# Patient Record
Sex: Female | Born: 1980 | Race: White | Hispanic: No
Health system: Southern US, Community
[De-identification: ages and names within clinical notes are randomized; demographics above are authoritative.]

## PROBLEM LIST (undated history)

## (undated) DIAGNOSIS — J45909 Unspecified asthma, uncomplicated: Secondary | ICD-10-CM

## (undated) DIAGNOSIS — Z8669 Personal history of other diseases of the nervous system and sense organs: Secondary | ICD-10-CM

## (undated) DIAGNOSIS — M542 Cervicalgia: Secondary | ICD-10-CM

## (undated) DIAGNOSIS — G43909 Migraine, unspecified, not intractable, without status migrainosus: Secondary | ICD-10-CM

## (undated) HISTORY — PX: APPENDECTOMY: SHX54

## (undated) HISTORY — DX: Personal history of other diseases of the nervous system and sense organs: Z86.69

## (undated) HISTORY — DX: Cervicalgia: M54.2

## (undated) HISTORY — DX: Unspecified asthma, uncomplicated: J45.909

---

## 2012-06-14 DIAGNOSIS — J45909 Unspecified asthma, uncomplicated: Secondary | ICD-10-CM | POA: Insufficient documentation

## 2013-09-03 ENCOUNTER — Emergency Department (HOSPITAL_BASED_OUTPATIENT_CLINIC_OR_DEPARTMENT_OTHER)
Admission: EM | Admit: 2013-09-03 | Discharge: 2013-09-03 | Disposition: A | Discharge status: Home / Self Care | Payer: Medicaid Other | Attending: Emergency Medicine | Admitting: Emergency Medicine

## 2013-09-03 ENCOUNTER — Encounter (HOSPITAL_BASED_OUTPATIENT_CLINIC_OR_DEPARTMENT_OTHER): Payer: Self-pay | Admitting: Emergency Medicine

## 2013-09-03 DIAGNOSIS — M542 Cervicalgia: Secondary | ICD-10-CM | POA: Diagnosis not present

## 2013-09-03 DIAGNOSIS — R51 Headache: Secondary | ICD-10-CM

## 2013-09-03 DIAGNOSIS — Z880 Allergy status to penicillin: Secondary | ICD-10-CM | POA: Insufficient documentation

## 2013-09-03 DIAGNOSIS — G43909 Migraine, unspecified, not intractable, without status migrainosus: Secondary | ICD-10-CM | POA: Diagnosis not present

## 2013-09-03 DIAGNOSIS — R519 Headache, unspecified: Secondary | ICD-10-CM

## 2013-09-03 HISTORY — DX: Migraine, unspecified, not intractable, without status migrainosus: G43.909

## 2013-09-03 MED ORDER — SODIUM CHLORIDE 0.9 % IV SOLN
Freq: Once | INTRAVENOUS | Status: AC
  Administered 2013-09-03: 19:00:00 via INTRAVENOUS

## 2013-09-03 MED ORDER — KETOROLAC TROMETHAMINE 30 MG/ML IJ SOLN
30.0000 mg | Freq: Once | INTRAMUSCULAR | Status: AC
  Administered 2013-09-03: 30 mg via INTRAVENOUS
  Filled 2013-09-03: qty 1

## 2013-09-03 MED ORDER — METOCLOPRAMIDE HCL 5 MG/ML IJ SOLN
10.0000 mg | Freq: Once | INTRAMUSCULAR | Status: AC
  Administered 2013-09-03: 10 mg via INTRAVENOUS
  Filled 2013-09-03: qty 2

## 2013-09-03 MED ORDER — DIPHENHYDRAMINE HCL 50 MG/ML IJ SOLN
25.0000 mg | Freq: Once | INTRAMUSCULAR | Status: AC
  Administered 2013-09-03: 25 mg via INTRAVENOUS
  Filled 2013-09-03: qty 1

## 2013-09-03 NOTE — ED Provider Notes (Signed)
CSN: [id number]     Arrival date & time 09/03/13  1703 History   First MD Initiated Contact with Patient 09/03/13 1724     Chief Complaint  Patient presents with  . Migraine     (Consider location/radiation/quality/duration/timing/severity/associated sxs/prior Treatment) Patient is a 33 y.o. female presenting with migraines. The history is provided by the patient. No language interpreter was used.  Migraine This is a new problem. The current episode started today. The problem occurs constantly. The problem has been unchanged. Associated symptoms include neck pain. Pertinent negatives include no vomiting. Nothing aggravates the symptoms. She has tried nothing for the symptoms. The treatment provided moderate relief.    Past Medical History  Diagnosis Date  . Migraines    Past Surgical History  Procedure Laterality Date  . Appendectomy     No family history on file. History  Substance Use Topics  . Smoking status: Never Smoker   . Smokeless tobacco: Not on file  . Alcohol Use: No   OB History   Grav Para Term Preterm Abortions TAB SAB Ect Mult Living                 Review of Systems  Gastrointestinal: Negative for vomiting.  Musculoskeletal: Positive for neck pain.  All other systems reviewed and are negative.     Allergies  Penicillins  Home Medications   Prior to Admission medications   Medication Sig Start Date End Date Taking? Authorizing Provider  Carisoprodol (SOMA PO) Take by mouth.   Yes Historical Provider, MD  HYDROcodone-acetaminophen (NORCO) 10-325 MG per tablet Take 1 tablet by mouth every 6 (six) hours as needed.   Yes Historical Provider, MD  isometheptene-acetaminophen-dichloralphenazone (MIDRIN) 65-325-100 MG capsule Take by mouth 4 (four) times daily as needed for migraine. Maximum 5 capsules in 12 hours for migraine headaches, 8 capsules in 24 hours for tension headaches.   Yes Historical Provider, MD  SUMAtriptan Succinate (IMITREX PO) Take by  mouth.   Yes Historical Provider, MD   BP 131/83  Pulse 94  Temp(Src) 98 F (36.7 C) (Oral)  Resp 16  Ht  (1.575 m)  Wt 170 lb (77.111 kg)  BMI 31.09 kg/m2  SpO2 100% Physical Exam  Nursing note and vitals reviewed. Constitutional: She is oriented to person, place, and time. She appears well-developed and well-nourished.  HENT:  Head: Normocephalic and atraumatic.  Eyes: Conjunctivae and EOM are normal. Pupils are equal, round, and reactive to light.  Neck: Normal range of motion.  Cardiovascular: Normal rate and normal heart sounds.   Pulmonary/Chest: Effort normal.  Abdominal: She exhibits no distension.  Musculoskeletal: Normal range of motion.  Neurological: She is alert and oriented to person, place, and time.  Skin: Skin is warm.  Psychiatric: She has a normal mood and affect.    ED Course  Procedures (including critical care time) Labs Review Labs Reviewed - No data to display  Imaging Review No results found.   EKG Interpretation None      MDM Pt has a bulging disc in her neck.   Pt is scheduled to se Neurosurgery in [place] for evaluation   Final diagnoses:  Headache disorder    Pt given torodol, reglan and benadryl  IV.      Lonia Skinner Mooar, PA-C 09/03/13 1956

## 2013-09-03 NOTE — Discharge Instructions (Signed)

## 2013-09-03 NOTE — ED Notes (Signed)
Pt c/o headache and photophobia x 2 days. Pt has h/o migraines. Pt sts midrin and imitrex usually helps. Pt drove self to ED. Pt denies n/v. Pt sts back of neck is "sore".

## 2013-09-04 NOTE — ED Provider Notes (Signed)
Medical screening examination/treatment/procedure(s) were performed by non-physician practitioner and as supervising physician I was immediately available for consultation/collaboration.   EKG Interpretation None        Yannick Steuber, MD 09/04/13 0957 

## 2013-10-09 DIAGNOSIS — M5030 Other cervical disc degeneration, unspecified cervical region: Secondary | ICD-10-CM | POA: Insufficient documentation

## 2013-10-09 DIAGNOSIS — G4486 Cervicogenic headache: Secondary | ICD-10-CM | POA: Insufficient documentation

## 2013-10-09 DIAGNOSIS — R51 Headache: Secondary | ICD-10-CM

## 2013-10-09 DIAGNOSIS — M542 Cervicalgia: Secondary | ICD-10-CM | POA: Insufficient documentation

## 2013-10-09 DIAGNOSIS — M4802 Spinal stenosis, cervical region: Secondary | ICD-10-CM | POA: Insufficient documentation

## 2013-10-09 DIAGNOSIS — M5412 Radiculopathy, cervical region: Secondary | ICD-10-CM | POA: Insufficient documentation

## 2013-12-17 ENCOUNTER — Other Ambulatory Visit: Payer: Self-pay | Admitting: Neurosurgery

## 2013-12-17 DIAGNOSIS — M5030 Other cervical disc degeneration, unspecified cervical region: Secondary | ICD-10-CM

## 2014-01-20 ENCOUNTER — Ambulatory Visit
Admission: RE | Admit: 2014-01-20 | Discharge: 2014-01-20 | Disposition: A | Discharge status: Home / Self Care | Payer: Medicaid Other | Source: Ambulatory Visit | Attending: Neurosurgery | Admitting: Neurosurgery

## 2014-01-20 DIAGNOSIS — M5030 Other cervical disc degeneration, unspecified cervical region: Secondary | ICD-10-CM

## 2014-01-20 MED ORDER — IOHEXOL 300 MG/ML  SOLN
1.0000 mL | Freq: Once | INTRAMUSCULAR | Status: AC | PRN
Start: 2014-01-20 — End: 2014-01-20
  Administered 2014-01-20: 1 mL via EPIDURAL

## 2014-01-20 MED ORDER — TRIAMCINOLONE ACETONIDE 40 MG/ML IJ SUSP (RADIOLOGY)
60.0000 mg | Freq: Once | INTRAMUSCULAR | Status: AC
  Administered 2014-01-20: 60 mg via EPIDURAL

## 2014-01-20 NOTE — Discharge Instructions (Signed)

## 2015-09-06 ENCOUNTER — Encounter (HOSPITAL_BASED_OUTPATIENT_CLINIC_OR_DEPARTMENT_OTHER): Payer: Self-pay | Admitting: *Deleted

## 2015-09-06 ENCOUNTER — Emergency Department (HOSPITAL_BASED_OUTPATIENT_CLINIC_OR_DEPARTMENT_OTHER)
Admission: EM | Admit: 2015-09-06 | Discharge: 2015-09-06 | Disposition: A | Discharge status: Home / Self Care | Payer: Medicaid Other | Attending: Emergency Medicine | Admitting: Emergency Medicine

## 2015-09-06 DIAGNOSIS — M542 Cervicalgia: Secondary | ICD-10-CM | POA: Diagnosis not present

## 2015-09-06 DIAGNOSIS — Y92410 Unspecified street and highway as the place of occurrence of the external cause: Secondary | ICD-10-CM | POA: Diagnosis not present

## 2015-09-06 DIAGNOSIS — Y9389 Activity, other specified: Secondary | ICD-10-CM | POA: Diagnosis not present

## 2015-09-06 DIAGNOSIS — R51 Headache: Secondary | ICD-10-CM | POA: Insufficient documentation

## 2015-09-06 DIAGNOSIS — Y999 Unspecified external cause status: Secondary | ICD-10-CM | POA: Insufficient documentation

## 2015-09-06 DIAGNOSIS — J45909 Unspecified asthma, uncomplicated: Secondary | ICD-10-CM | POA: Diagnosis not present

## 2015-09-06 DIAGNOSIS — S199XXA Unspecified injury of neck, initial encounter: Secondary | ICD-10-CM | POA: Diagnosis present
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
## 2015-09-06 NOTE — Discharge Instructions (Signed)
Follow up with your primary care provider in 2-3 days if symptoms are not improving. Use ice and heat on your neck and use ibuprofen as needed for pain.    Return to the emergency department if you experience headache, visual changes, abdominal pain, nausea, vomiting, numbness/timing, weakness or any other concerning symptoms.
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
## 2017-04-17 ENCOUNTER — Encounter: Payer: Self-pay | Admitting: Medical

## 2017-04-17 ENCOUNTER — Ambulatory Visit: Payer: 59 | Admitting: Medical

## 2017-04-17 VITALS — BP 116/79 | HR 94 | Temp 98.1°F | Resp 16 | Ht 62.0 in | Wt 138.0 lb

## 2017-04-17 DIAGNOSIS — N926 Irregular menstruation, unspecified: Secondary | ICD-10-CM | POA: Diagnosis not present

## 2017-04-17 DIAGNOSIS — Z8669 Personal history of other diseases of the nervous system and sense organs: Secondary | ICD-10-CM | POA: Diagnosis not present

## 2017-04-17 DIAGNOSIS — M542 Cervicalgia: Secondary | ICD-10-CM

## 2017-04-17 LAB — POCT URINE PREGNANCY: Preg Test, Ur: NEGATIVE

## 2017-04-17 MED ORDER — MELOXICAM 7.5 MG PO TABS: ORAL_TABLET | ORAL | 0 refills | Status: DC

## 2017-04-17 MED ORDER — SUMATRIPTAN SUCCINATE 50 MG PO TABS: 50.0000 mg | ORAL_TABLET | ORAL | 0 refills | Status: DC | PRN

## 2017-04-17 NOTE — Progress Notes (Signed)
Subjective:    Patient ID: Kylie Hart, female    DOB: April 13, 1980, 37 y.o.   MRN: [id number]  HPI   Pt in for first time.  Pt states she is on her lunch break.  Pt expresses needs to return to work. Pt accrued large bill in the past with former practice. She can't return there until bill paid.  She has history of neck pain and had xrays done. Pt states she had neck procedure in the past. Pt in past has seen neurosurgeon. Her neck issue could have required surgery but due to her age they opted for PT instead. Pt does have some radicular pain in her neck.   Last couple of years she states was pain medication and muscle relaxers.   Pt last saw PA at [facility] 3 months ago approximate. They were giving her refills of muscle relaxants. Pt was getting somas. Also in the past was getting some pain medication intermittently.   Pt also mentions she also had possible overdose. She was on clonazepam at that time.   lmp- Pt states not sure when her last menstrual cycle was.   She used to be on depo injection. Used to be on depo(pt states not pregnant since not active).    Review of Systems  HENT: Negative for congestion.   Respiratory: Negative for cough, chest tightness, shortness of breath and wheezing.   Cardiovascular: Negative for chest pain and palpitations.  Gastrointestinal: Negative for abdominal pain.  Genitourinary: Negative for difficulty urinating, dysuria, enuresis and genital sores.  Musculoskeletal: Positive for neck pain. Negative for back pain, joint swelling and neck stiffness.  Skin: Negative for rash.  Neurological: Negative for dizziness, seizures, speech difficulty, weakness and light-headedness.  Hematological: Negative for adenopathy. Does not bruise/bleed easily.  Psychiatric/Behavioral: Negative for behavioral problems, confusion, sleep disturbance and suicidal ideas. The patient is not nervous/anxious.     Past Medical History:  Diagnosis Date  . Migraines        Social History   Socioeconomic History  . Marital status: Married    Spouse name: Not on file  . Number of children: Not on file  . Years of education: Not on file  . Highest education level: Not on file  Occupational History  . Not on file  Social Needs  . Financial resource strain: Not on file  . Food insecurity:    Worry: Not on file    Inability: Not on file  . Transportation needs:    Medical: Not on file    Non-medical: Not on file  Tobacco Use  . Smoking status: Never Smoker  . Smokeless tobacco: Never Used  Substance and Sexual Activity  . Alcohol use: No  . Drug use: No  . Sexual activity: Yes    Birth control/protection: Injection  Lifestyle  . Physical activity:    Days per week: Not on file    Minutes per session: Not on file  . Stress: Not on file  Relationships  . Social connections:    Talks on phone: Not on file    Gets together: Not on file    Attends religious service: Not on file    Active member of club or organization: Not on file    Attends meetings of clubs or organizations: Not on file    Relationship status: Not on file  . Intimate partner violence:    Fear of current or ex partner: Not on file    Emotionally abused: Not on file  Physically abused: Not on file    Forced sexual activity: Not on file  Other Topics Concern  . Not on file  Social History Narrative  . Not on file    Past Surgical History:  Procedure Laterality Date  . APPENDECTOMY      Family History  Problem Relation Age of Onset  . COPD Mother   . Breast cancer Mother     Allergies  Allergen Reactions  . Penicillins     Current Outpatient Medications on File Prior to Visit  Medication Sig Dispense Refill  . Carisoprodol (SOMA PO) Take by mouth.    Marland Kitchen. ibuprofen (ADVIL,MOTRIN) 200 MG tablet Take 200 mg by mouth.    . isometheptene-acetaminophen-dichloralphenazone (MIDRIN) 65-325-100 MG capsule Take by mouth.    . SUMAtriptan Succinate (IMITREX PO) Take  by mouth.     No current facility-administered medications on file prior to visit.     BP 116/79   Pulse 94   Temp 98.1 F (36.7 C) (Oral)   Resp 16   Ht 5\' 2"  (1.575 m)   Wt 138 lb (62.6 kg)   SpO2 99%   BMI 25.24 kg/m       Objective:   Physical Exam  General Mental Status- Alert. General Appearance- Not in acute distress.   Skin General: Color- Normal Color. Moisture- Normal Moisture.  Neck Carotid Arteries- Normal color. Moisture- Normal Moisture. No carotid bruits. No JVD.  Chest and Lung Exam Auscultation: Breath Sounds:-Normal.  Cardiovascular Auscultation:Rythm- Regular. Murmurs & Other Heart Sounds:Auscultation of the heart reveals- No Murmurs.  Abdomen Inspection:-Inspeection Normal. Palpation/Percussion:Note:No mass. Palpation and Percussion of the abdomen reveal- Non Tender, Non Distended + BS, no rebound or guarding.  Neurologic Cranial Nerve exam:- CN III-XII intact(No nystagmus), symmetric smile. Strength:- 5/5 equal and symmetric strength both upper and lower extremities.      Assessment & Plan:  For your history of chronic neck pain, I do want you to try meloxicam 1-2 tabs daily as needed.  Also can continue the Soma presently but in the near future I might prefer to use other muscle relaxant such as Zanaflex.  Think this would be safer and less sedating.  For history of migraines, I did refill your Imitrex.  Maximum number of tablets per day for Imitrex would be 2 tablets within any 24-hour.  Please sign release of information form so we can get your old records from [place].  On the way out you can ask receptionist staff availability for CPE next week.

## 2017-04-17 NOTE — Patient Instructions (Signed)
For your history of chronic neck pain, I do want you to try meloxicam 1-2 tabs daily as needed.  Also can continue the Soma presently but in the near future I might prefer to use other muscle relaxant such as Zanaflex.  Think this would be safer and less sedating.  Esperanza RichtersEdward Adelbert Gaspard, PA-C  For history of migraines, I did refill your Imitrex.  Maximum number of tablets per day for Imitrex would be 2 tablets within any 24-hour.  Please sign release of information form so we can get your old records from [place].  On the way out you can ask receptionist staff availability for CPE next week.

## 2017-07-25 ENCOUNTER — Encounter: Payer: Self-pay | Admitting: Medical

## 2017-07-25 ENCOUNTER — Ambulatory Visit: Payer: 59 | Admitting: Medical

## 2017-07-25 VITALS — BP 115/77 | HR 80 | Temp 98.3°F | Ht 62.0 in | Wt 180.4 lb

## 2017-07-25 DIAGNOSIS — M542 Cervicalgia: Secondary | ICD-10-CM | POA: Diagnosis not present

## 2017-07-25 DIAGNOSIS — Z8669 Personal history of other diseases of the nervous system and sense organs: Secondary | ICD-10-CM

## 2017-07-25 MED ORDER — MELOXICAM 7.5 MG PO TABS: ORAL_TABLET | ORAL | 0 refills | Status: DC

## 2017-07-25 MED ORDER — TIZANIDINE HCL 6 MG PO CAPS: 6.0000 mg | ORAL_CAPSULE | Freq: Three times a day (TID) | ORAL | 0 refills | Status: AC | PRN

## 2017-07-25 MED ORDER — TIZANIDINE HCL 6 MG PO CAPS: 6.0000 mg | ORAL_CAPSULE | Freq: Three times a day (TID) | ORAL | 0 refills | Status: DC | PRN

## 2017-07-25 MED ORDER — SUMATRIPTAN SUCCINATE 50 MG PO TABS: 50.0000 mg | ORAL_TABLET | ORAL | 0 refills | Status: DC | PRN

## 2017-07-25 MED ORDER — MELOXICAM 7.5 MG PO TABS: ORAL_TABLET | ORAL | 0 refills | Status: AC

## 2017-07-25 MED ORDER — SUMATRIPTAN SUCCINATE 50 MG PO TABS: 50.0000 mg | ORAL_TABLET | ORAL | 0 refills | Status: AC | PRN

## 2017-07-25 NOTE — Patient Instructions (Addendum)
For history of neck pain I placed c spine xray order. Please get that today or within a week.  I prescribed meloxicam for neck pain and also zanaflex muscle relaxant.   For migraine ha history I refilled imitrex.  Please sign release form so we can get old records. [facility] Monday and let them know we are sending record request.   Please update me in two weeks how your are doing with the above.  Follow up one month or as needed

## 2017-07-25 NOTE — Progress Notes (Signed)
Pre visit review using our clinic review tool, if applicable. No additional management support is needed unless otherwise documented below in the visit note. 

## 2017-07-25 NOTE — Progress Notes (Signed)
Subjective:    Patient ID: Kylie Hart, female    DOB: Sep 14, 1980, 37 y.o.   MRN: [id number]  HPI  Pt in for follow up.  Pt does have history of using soma for migraines with neck pain.  Pt did not take mobic that I rx'd on last visit.   I did consider giving her zanaflex. But looks like I did not prescribe that. She actually had soma and appears this is why did not write. I explained to her soma in not preferred muscle relaxant and that I don't feel comfortable prescribing soma in most cases.   Pt did not fill the imitrex.  I have not gotten records yet.   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 Surgery was recommended in the past about 5 years ago but she states did not do.  She summarizes she get daily neck pain and HA for years but with no different features and does have any gross motor/sensory function deficits.  No current HA presently.  LMP- 3 weeks ago.    Review of Systems  Constitutional: Negative for chills, fatigue and fever.  Respiratory: Negative for cough, chest tightness, shortness of breath and wheezing.   Cardiovascular: Negative for chest pain and palpitations.  Gastrointestinal: Negative for abdominal distention, abdominal pain and anal bleeding.  Musculoskeletal: Positive for neck pain. Negative for back pain, joint swelling and myalgias.  Neurological: Negative for dizziness, seizures, speech difficulty, weakness, light-headedness and numbness.       Not currently with ha. But states almost every day.  Hematological: Negative for adenopathy. Does not bruise/bleed easily.  Psychiatric/Behavioral: Negative for behavioral problems and confusion.    Past Medical History:  Diagnosis Date  . Asthma    when younger but as adult no major issues per pt.  . History of migraine   . Migraines   . Neck pain      Social History   Socioeconomic History  . Marital status: Married    Spouse name: Not on file  . Number of  children: Not on file  . Years of education: Not on file  . Highest education level: Not on file  Occupational History  . Not on file  Social Needs  . Financial resource strain: Not on file  . Food insecurity:    Worry: Not on file    Inability: Not on file  . Transportation needs:    Medical: Not on file    Non-medical: Not on file  Tobacco Use  . Smoking status: Never Smoker  . Smokeless tobacco: Never Used  Substance and Sexual Activity  . Alcohol use: No  . Drug use: No  . Sexual activity: Yes    Birth control/protection: Injection  Lifestyle  . Physical activity:    Days per week: Not on file    Minutes per session: Not on file  . Stress: Not on file  Relationships  . Social connections:    Talks on phone: Not on file    Gets together: Not on file    Attends religious service: Not on file    Active member of club or organization: Not on file    Attends meetings of clubs or organizations: Not on file    Relationship status: Not on file  . Intimate partner violence:    Fear of current or ex partner: Not on file    Emotionally abused: Not on file    Physically abused: Not on file  Forced sexual activity: Not on file  Other Topics Concern  . Not on file  Social History Narrative  . Not on file    Past Surgical History:  Procedure Laterality Date  . APPENDECTOMY      Family History  Problem Relation Age of Onset  . COPD Mother   . Breast cancer Mother     Allergies  Allergen Reactions  . Penicillins     No current outpatient medications on file prior to visit.   No current facility-administered medications on file prior to visit.     BP 115/77 (BP Location: Left Arm, Patient Position: Sitting, Cuff Size: Small)   Pulse 80   Temp 98.3 F (36.8 C) (Oral)   Ht 5\' 2"  (1.575 m)   Wt 180 lb 6 oz (81.8 kg)   SpO2 100%   BMI 32.99 kg/m       Objective:   Physical Exam  General Mental Status- Alert. General Appearance- Not in acute distress.     Skin General: Color- Normal Color. Moisture- Normal Moisture.  Neck Carotid Arteries- Normal color. Moisture- Normal Moisture. No carotid bruits. No JVD. Bilateral trapezius tenderness to palpation. Mild mid cervical spinal tenderness.   Chest and Lung Exam Auscultation: Breath Sounds:-Normal.  Cardiovascular Auscultation:Rythm- Regular. Murmurs & Other Heart Sounds:Auscultation of the heart reveals- No Murmurs.   Neurologic Cranial Nerve exam:- CN III-XII intact(No nystagmus), symmetric smile. Strength:- 5/5 equal and symmetric strength both upper and lower extremities.      Assessment & Plan:  For history of neck pain I placed c spine xray order. Please get that today or within a week.  I prescribed meloxicam for neck pain and also zanaflex muscle relaxant.   For migraine ha history I refilled imitrex.  Please sign release form so we can get old records. Please call [facility] on Monday and let them know we are sending record request.   Please update me in two weeks how your are doing with the above.  Follow up one month or as needed   Whole FoodsEdward Jeslin Bazinet, PA-C

## 2017-07-29 ENCOUNTER — Encounter: Payer: Self-pay | Admitting: Medical

## 2017-07-30 ENCOUNTER — Telehealth: Payer: Self-pay | Admitting: Medical

## 2017-07-30 NOTE — Telephone Encounter (Signed)
Copied from CRM [id number]. Topic: Quick Communication - See Telephone Encounter >> Jul 30, 2017  5:35 PM Lorrine KinMcGee, Dakiyah Heinke B, NT wrote: CRM for notification. See Telephone encounter for: 07/30/17. Patient calling and is requesting a call back from Esperanza RichtersEdward Saguier to discuss a different muscle relaxer that is cheaper. Please advise.  [id number] [place] - [address] AT [address] CB#: [phone]

## 2017-07-31 ENCOUNTER — Encounter: Payer: Self-pay | Admitting: Medical

## 2017-07-31 ENCOUNTER — Telehealth: Payer: Self-pay | Admitting: Medical

## 2017-07-31 NOTE — Telephone Encounter (Signed)
I appreciate your response back. However, I do think at this time, I may just look into other options. You are a big practice, and I know how that goes, I, as a patient get lost. And, my daily health is most important to me. I think it is absurd that apparently so many doctors are against giving me the medication I have been on for years, a muscle relaxer, not even something as severe as a pain medication. So, I will see what other options fit me and my schedule at this time.       Lucretia RoersLindsey Cobb    Above is patient email message. Thus I decided dismissal was in our mutual best interest.

## 2017-07-31 NOTE — Telephone Encounter (Signed)
I typed patient dismissal letter. Tried to print from home. Not sure if it was printed. Will you print and then give for me to sign.  Note to whom it is concerned regarding pt dismissal. See her my chart message where she states it is absurd that I won't prescribe soma and she expresses she will be seeking care at other facility.

## 2017-07-31 NOTE — Telephone Encounter (Signed)
Pt called to f/up on request.

## 2017-07-31 NOTE — Telephone Encounter (Signed)
Will you get pt dismissal letter off the printer and give to me.

## 2017-08-01 ENCOUNTER — Telehealth: Payer: Self-pay | Admitting: Medical

## 2017-08-01 NOTE — Telephone Encounter (Signed)
Discussed with pharmacist patient's cost for Zanaflex and Skelaxin.  Both generics cost about $85.  I do not want to prescribe her Soma.  I did ask pharmacist to run Flexeril and I believe he mentioned 10 mg tablets #30 would cost patient about $2.  So I will send her back in email seeing if she is ever used that before and will give her a brief course during the interim while she finds another provider as she stated in the email and while we wait for her to get dismissal letter as well.

## 2017-08-01 NOTE — Telephone Encounter (Signed)
Opened to review 

## 2017-08-02 MED ORDER — CYCLOBENZAPRINE HCL 10 MG PO TABS: ORAL_TABLET | ORAL | 0 refills | Status: AC

## 2017-08-02 NOTE — Telephone Encounter (Signed)
Sent my chart message.(have her review) Ask her to review can send in flexeril. Per her pharmacist cost $2 dollars approximate. If ok with that will send in.

## 2017-08-08 ENCOUNTER — Telehealth: Payer: Self-pay | Admitting: Medical

## 2017-08-08 NOTE — Telephone Encounter (Signed)
Patient dismissed from [facility] by Esperanza RichtersEdward Saguier PA-C, effective July 31, 2017. Dismissal letter sent out by certified / registered mail.  daj

## 2017-09-18 ENCOUNTER — Telehealth: Payer: Self-pay | Admitting: Medical

## 2017-09-18 NOTE — Telephone Encounter (Signed)
Rec'd Certified Letter - Return to Sender, Undeliverable, Unclaimed, Unable to [employer].  Letter has been remailed 1st class 09/18/17

## 2018-02-10 ENCOUNTER — Emergency Department (HOSPITAL_BASED_OUTPATIENT_CLINIC_OR_DEPARTMENT_OTHER): Payer: Self-pay

## 2018-02-10 ENCOUNTER — Other Ambulatory Visit: Payer: Self-pay

## 2018-02-10 ENCOUNTER — Emergency Department (HOSPITAL_BASED_OUTPATIENT_CLINIC_OR_DEPARTMENT_OTHER)
Admission: EM | Admit: 2018-02-10 | Discharge: 2018-02-10 | Disposition: A | Discharge status: Home / Self Care | Payer: Self-pay | Attending: Emergency Medicine | Admitting: Emergency Medicine

## 2018-02-10 ENCOUNTER — Encounter (HOSPITAL_BASED_OUTPATIENT_CLINIC_OR_DEPARTMENT_OTHER): Payer: Self-pay | Admitting: Emergency Medicine

## 2018-02-10 DIAGNOSIS — J45909 Unspecified asthma, uncomplicated: Secondary | ICD-10-CM | POA: Insufficient documentation

## 2018-02-10 DIAGNOSIS — J181 Lobar pneumonia, unspecified organism: Secondary | ICD-10-CM

## 2018-02-10 DIAGNOSIS — G43109 Migraine with aura, not intractable, without status migrainosus: Secondary | ICD-10-CM | POA: Insufficient documentation

## 2018-02-10 DIAGNOSIS — J189 Pneumonia, unspecified organism: Secondary | ICD-10-CM | POA: Insufficient documentation

## 2018-02-10 LAB — PREGNANCY, URINE: Preg Test, Ur: NEGATIVE

## 2018-02-10 MED ORDER — KETOROLAC TROMETHAMINE 30 MG/ML IJ SOLN
30.0000 mg | Freq: Once | INTRAMUSCULAR | Status: AC
  Administered 2018-02-10: 30 mg via INTRAVENOUS
  Filled 2018-02-10: qty 1

## 2018-02-10 MED ORDER — DOXYCYCLINE HYCLATE 100 MG PO CAPS: 100.0000 mg | ORAL_CAPSULE | Freq: Two times a day (BID) | ORAL | 0 refills | Status: AC

## 2018-02-10 MED ORDER — SODIUM CHLORIDE 0.9 % IV BOLUS
1000.0000 mL | Freq: Once | INTRAVENOUS | Status: AC
  Administered 2018-02-10: 1000 mL via INTRAVENOUS

## 2018-02-10 MED ORDER — ALBUTEROL SULFATE HFA 108 (90 BASE) MCG/ACT IN AERS: 1.0000 | INHALATION_SPRAY | Freq: Four times a day (QID) | RESPIRATORY_TRACT | 0 refills | Status: AC | PRN

## 2018-02-10 MED ORDER — BENZONATATE 100 MG PO CAPS: 100.0000 mg | ORAL_CAPSULE | Freq: Three times a day (TID) | ORAL | 0 refills | Status: AC

## 2018-02-10 NOTE — ED Triage Notes (Addendum)
5 days of URI symptoms, states "I took some clindamycin I had left over." Now states that she is having a migraine headache.

## 2018-02-10 NOTE — ED Notes (Signed)
Cough, congestion x 5 days  Took antibiotics that she had left over now has a ha

## 2018-02-10 NOTE — ED Notes (Signed)
Patient transported to X-ray 

## 2018-02-10 NOTE — Discharge Instructions (Signed)
Take the antibiotics as prescribed.  Please complete the entire course of antibiotics regardless of symptom improvement to prevent worsening or recurrence of your infection. Take Tessalon Perles as needed for cough. Continue your home medications as previously prescribed, but please discontinue the clindamycin. Return to ED for worsening symptoms, shortness of breath, chest pain, vomiting or coughing up blood or wheezing.

## 2018-02-10 NOTE — ED Provider Notes (Signed)
[facility] EMERGENCY DEPARTMENT Provider Note   CSN: [id number] Arrival date & time: 02/10/18  1350     History   Chief Complaint Chief Complaint  Patient presents with  . URI  . Migraine    HPI Kylie RoersLindsey Hitchman is a 38 y.o. female with a past medical history of migraine headaches, who presents to ED for evaluation of 5 day history of cough productive with mucus, bodyaches, bilateral ear pain/pressure.  Woke up this morning with a migraine headache.  She reports associated photophobia and phonophobia which is typical of her migraines.  States that this feels like her usual migraines.  No improvement with ibuprofen or Tylenol.  She has been taking several doses of clindamycin left over for the past several days.  Patient appears to be a poor historian so I am unsure how much clindamycin she took.  Did not receive her influenza vaccine this year.  Denies any recent travel, shortness of breath, vision changes, head injuries or falls. Denies personal or family history of aneurysms.  HPI  Past Medical History:  Diagnosis Date  . Asthma    when younger but as adult no major issues per pt.  . History of migraine   . Migraines   . Neck pain     Patient Active Problem List   Diagnosis Date Noted  . 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 Cervical pain 10/09/2013  . DDD (degenerative disc disease), cervical 10/09/2013  . Cervical spinal stenosis 10/09/2013  . Cervicogenic headache 10/09/2013  . 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 APPENDECTOMY       OB History   No obstetric history on file.      Home Medications    Prior to Admission medications   Medication Sig Start Date End Date Taking? Authorizing Provider  albuterol (PROVENTIL HFA;VENTOLIN HFA) 108 (90 Base) MCG/ACT inhaler Inhale 1-2 puffs into the lungs every 6 (six) hours as needed for wheezing or shortness of breath. 02/10/18   Tommy Goostree, PA-C    benzonatate (TESSALON) 100 MG capsule Take 1 capsule (100 mg total) by mouth every 8 (eight) hours. 02/10/18   Gianni Mihalik, PA-C  cyclobenzaprine (FLEXERIL) 10 MG tablet Take 1 tab every 8 hours as needed for pain. 08/02/17   Saguier, Ramon DredgeEdward, PA-C  doxycycline (VIBRAMYCIN) 100 MG capsule Take 1 capsule (100 mg total) by mouth 2 (two) times daily for 5 days. 02/10/18 02/15/18  Dietrich PatesKhatri, Oziel Beitler, PA-C  meloxicam (MOBIC) 7.5 MG tablet 1-2 tab po q day 07/25/17   Saguier, Ramon DredgeEdward, PA-C  SUMAtriptan (IMITREX) 50 MG tablet Take 1 tablet (50 mg total) by mouth every 2 (two) hours as needed for migraine. May repeat in 2 hours if headache persists or recurs. 07/25/17   Saguier, Ramon DredgeEdward, PA-C  tizanidine (ZANAFLEX) 6 MG capsule Take 1 capsule (6 mg total) by mouth 3 (three) times daily as needed for muscle spasms. 07/25/17   Saguier, Ramon DredgeEdward, PA-C    Family History Family History  Problem Relation Age of Onset  . COPD Mother   . Breast cancer Mother     Social History Social History   Tobacco Use  . Smoking status: Never Smoker  . Smokeless tobacco: Never Used  Substance Use Topics  . Alcohol use: No  . Drug use: No     Allergies   Penicillins   Review of Systems Review of Systems  Constitutional: Negative for appetite change, chills and fever.  HENT:  Positive for congestion and ear pain. Negative for rhinorrhea, sneezing and sore throat.   Eyes: Positive for photophobia. Negative for visual disturbance.  Respiratory: Positive for cough. Negative for chest tightness, shortness of breath and wheezing.   Cardiovascular: Negative for chest pain and palpitations.  Gastrointestinal: Negative for abdominal pain, blood in stool, constipation, diarrhea, nausea and vomiting.  Genitourinary: Negative for dysuria, hematuria and urgency.  Musculoskeletal: Positive for myalgias.  Skin: Negative for rash.  Neurological: Positive for headaches. Negative for dizziness, syncope, weakness and light-headedness.      Physical Exam Updated Vital Signs BP 115/85 (BP Location: Left Arm)   Pulse 91   Temp 98.8 F (37.1 C) (Oral)   Resp 16   SpO2 97%   Physical Exam Vitals signs and nursing note reviewed.  Constitutional:      General: She is not in acute distress.    Appearance: She is well-developed.  HENT:     Head: Normocephalic and atraumatic.     Right Ear: A middle ear effusion is present.     Left Ear: A middle ear effusion is present.     Nose: Nose normal.     Mouth/Throat:     Pharynx: Oropharynx is clear. Uvula midline.     Tonsils: Swelling: 0 on the right. 0 on the left.  Eyes:     General: No scleral icterus.       Right eye: No discharge.        Left eye: No discharge.     Conjunctiva/sclera: Conjunctivae normal.     Pupils: Pupils are equal, round, and reactive to light.  Neck:     Musculoskeletal: Normal range of motion and neck supple.  Cardiovascular:     Rate and Rhythm: Regular rhythm. Tachycardia present.     Heart sounds: Normal heart sounds. No murmur. No friction rub. No gallop.   Pulmonary:     Effort: Pulmonary effort is normal. No respiratory distress.     Breath sounds: Normal breath sounds.  Abdominal:     General: Bowel sounds are normal. There is no distension.     Palpations: Abdomen is soft.     Tenderness: There is no abdominal tenderness. There is no guarding.  Musculoskeletal: Normal range of motion.  Skin:    General: Skin is warm and dry.     Findings: No rash.  Neurological:     General: No focal deficit present.     Mental Status: She is alert and oriented to person, place, and time.     Cranial Nerves: No cranial nerve deficit.     Sensory: No sensory deficit.     Motor: No weakness or abnormal muscle tone.     Coordination: Coordination normal.      ED Treatments / Results  Labs (all labs ordered are listed, but only abnormal results are displayed) Labs Reviewed  PREGNANCY, URINE    EKG None  Radiology Dg Chest 2  View  Result Date: 02/10/2018 CLINICAL DATA:  Initial evaluation for 5 day history of cough, fever, URI symptoms. EXAM: CHEST - 2 VIEW COMPARISON:  Prior radiograph from 10/21/2013 FINDINGS: Cardiac and mediastinal silhouettes are stable in size and contour, and remain within normal limits. Lungs are hypoinflated with secondary mild bibasilar bronchovascular crowding. There is superimposed somewhat more confluent patchy opacity within the retrocardiac left lower lobe, which could potentially reflect a small infiltrate. This is not well seen on lateral view. No edema or effusion. No pneumothorax. Visualized osseous structures  within normal limits. IMPRESSION: Patchy retrocardiac left lower lobe opacity, potentially affecting a small/early infiltrate given provided history of cough/fever. Electronically Signed   By: Rise Mu M.D.   On: 02/10/2018 15:13    Procedures Procedures (including critical care time)  Medications Ordered in ED Medications  sodium chloride 0.9 % bolus 1,000 mL (0 mLs Intravenous Stopped 02/10/18 1618)  ketorolac (TORADOL) 30 MG/ML injection 30 mg (30 mg Intravenous Given 02/10/18 1547)     Initial Impression / Assessment and Plan / ED Course  I have reviewed the triage vital signs and the nursing notes.  Pertinent labs & imaging results that were available during my care of the patient were reviewed by me and considered in my medical decision making (see chart for details).     38 year old female presents to ED for 5-day history of cough productive with mucus, body aches, bilateral ear pain.  Woke up this morning with a migraine.  She has a history of migraines and this feels similar.  She has been self-medicating herself with several doses of clindamycin left over from a prior and infection.  On my exam she is overall well-appearing.  No deficits on neurological exam noted.  Vital signs are within normal limits.  Chest x-ray shows possible developing infiltrate in  the left lower lobe.  Patient reports improvement in symptoms with Toradol and fluids.  Suspect that her symptoms are due to her pneumonia. There are no headache characteristics that are lateralizing or concerning for increased ICP, infectious or vascular cause of her symptoms.  Will advise her to return to ED for any severe worsening symptoms.  Patient is hemodynamically stable, in NAD, and able to ambulate in the ED. Evaluation does not show pathology that would require ongoing emergent intervention or inpatient treatment. I explained the diagnosis to the patient. Pain has been managed and has no complaints prior to discharge. Patient is comfortable with above plan and is stable for discharge at this time. All questions were answered prior to disposition. Strict return precautions for returning to the ED were discussed. Encouraged follow up with PCP.    Portions of this note were generated with Scientist, clinical (histocompatibility and immunogenetics). Dictation errors may occur despite best attempts at proofreading.   Final Clinical Impressions(s) / ED Diagnoses   Final diagnoses:  Community acquired pneumonia of left lower lobe of lung (HCC)  Migraine with aura and without status migrainosus, not intractable    ED Discharge Orders         Ordered    doxycycline (VIBRAMYCIN) 100 MG capsule  2 times daily     02/10/18 1639    benzonatate (TESSALON) 100 MG capsule  Every 8 hours     02/10/18 1639    albuterol (PROVENTIL HFA;VENTOLIN HFA) 108 (90 Base) MCG/ACT inhaler  Every 6 hours PRN     02/10/18 1640           Dietrich Pates, PA-C 02/10/18 1643    Alvira Monday, MD 02/14/18 2217

## 2019-01-22 ENCOUNTER — Emergency Department (HOSPITAL_BASED_OUTPATIENT_CLINIC_OR_DEPARTMENT_OTHER)
Admission: EM | Admit: 2019-01-22 | Discharge: 2019-01-23 | Disposition: A | Discharge status: Home / Self Care | Payer: Self-pay | Attending: Emergency Medicine | Admitting: Emergency Medicine

## 2019-01-22 ENCOUNTER — Other Ambulatory Visit: Payer: Self-pay

## 2019-01-22 DIAGNOSIS — K529 Noninfective gastroenteritis and colitis, unspecified: Secondary | ICD-10-CM | POA: Insufficient documentation

## 2019-01-22 DIAGNOSIS — Z79899 Other long term (current) drug therapy: Secondary | ICD-10-CM | POA: Insufficient documentation

## 2019-01-22 DIAGNOSIS — Z880 Allergy status to penicillin: Secondary | ICD-10-CM | POA: Insufficient documentation

## 2019-01-22 DIAGNOSIS — E876 Hypokalemia: Secondary | ICD-10-CM | POA: Insufficient documentation

## 2019-01-23 ENCOUNTER — Emergency Department (HOSPITAL_BASED_OUTPATIENT_CLINIC_OR_DEPARTMENT_OTHER): Payer: Self-pay

## 2019-01-23 ENCOUNTER — Encounter (HOSPITAL_BASED_OUTPATIENT_CLINIC_OR_DEPARTMENT_OTHER): Payer: Self-pay | Admitting: *Deleted

## 2019-01-23 ENCOUNTER — Other Ambulatory Visit: Payer: Self-pay

## 2019-01-23 LAB — COMPREHENSIVE METABOLIC PANEL
ALT: 21 U/L (ref 0–44)
AST: 31 U/L (ref 15–41)
Albumin: 4.8 g/dL (ref 3.5–5.0)
Alkaline Phosphatase: 34 U/L — ABNORMAL LOW (ref 38–126)
Anion gap: 11 (ref 5–15)
BUN: 18 mg/dL (ref 6–20)
CO2: 22 mmol/L (ref 22–32)
Calcium: 9.2 mg/dL (ref 8.9–10.3)
Chloride: 102 mmol/L (ref 98–111)
Creatinine, Ser: 0.61 mg/dL (ref 0.44–1.00)
GFR calc Af Amer: >60 mL/min (ref >60)
GFR calc non Af Amer: >60 mL/min (ref >60)
Glucose, Bld: 96 mg/dL (ref 70–99)
Potassium: 2.9 mmol/L — ABNORMAL LOW (ref 3.5–5.1)
Sodium: 135 mmol/L (ref 135–145)
Total Bilirubin: 1 mg/dL (ref 0.3–1.2)
Total Protein: 7.6 g/dL (ref 6.5–8.1)

## 2019-01-23 LAB — CBC WITH DIFFERENTIAL/PLATELET
Abs Immature Granulocytes: 0.03 10*3/uL (ref 0.00–0.07)
Basophils Absolute: 0.1 10*3/uL (ref 0.0–0.1)
Basophils Relative: 1 %
Eosinophils Absolute: 0.1 10*3/uL (ref 0.0–0.5)
Eosinophils Relative: 1 %
HCT: 39.5 % (ref 36.0–46.0)
Hemoglobin: 13.5 g/dL (ref 12.0–15.0)
Immature Granulocytes: 0 %
Lymphocytes Relative: 24 %
Lymphs Abs: 2.1 10*3/uL (ref 0.7–4.0)
MCH: 31.1 pg (ref 26.0–34.0)
MCHC: 34.2 g/dL (ref 30.0–36.0)
MCV: 91 fL (ref 80.0–100.0)
Monocytes Absolute: 0.6 10*3/uL (ref 0.1–1.0)
Monocytes Relative: 7 %
Neutro Abs: 5.9 10*3/uL (ref 1.7–7.7)
Neutrophils Relative %: 67 %
Platelets: 280 10*3/uL (ref 150–400)
RBC: 4.34 MIL/uL (ref 3.87–5.11)
RDW: 12.1 % (ref 11.5–15.5)
WBC: 8.7 10*3/uL (ref 4.0–10.5)
nRBC: 0 % (ref 0.0–0.2)

## 2019-01-23 LAB — URINALYSIS, ROUTINE W REFLEX MICROSCOPIC
Bilirubin Urine: NEGATIVE
Glucose, UA: NEGATIVE mg/dL
Hgb urine dipstick: NEGATIVE
Ketones, ur: 40 mg/dL — AB
Leukocytes,Ua: NEGATIVE
Nitrite: NEGATIVE
Protein, ur: NEGATIVE mg/dL
Specific Gravity, Urine: >1.03 — ABNORMAL HIGH (ref 1.005–1.030)
pH: 6 (ref 5.0–8.0)

## 2019-01-23 LAB — PREGNANCY, URINE: Preg Test, Ur: NEGATIVE

## 2019-01-23 LAB — LIPASE, BLOOD: Lipase: 58 U/L — ABNORMAL HIGH (ref 11–51)

## 2019-01-23 MED ORDER — METRONIDAZOLE 500 MG PO TABS: 500.0000 mg | ORAL_TABLET | Freq: Three times a day (TID) | ORAL | 0 refills | Status: AC

## 2019-01-23 MED ORDER — CIPROFLOXACIN HCL 500 MG PO TABS: 500.0000 mg | ORAL_TABLET | Freq: Two times a day (BID) | ORAL | 0 refills | Status: AC

## 2019-01-23 MED ORDER — SODIUM CHLORIDE 0.9 % IV BOLUS (SEPSIS)
1000.0000 mL | Freq: Once | INTRAVENOUS | Status: AC
  Administered 2019-01-23: 1000 mL via INTRAVENOUS

## 2019-01-23 MED ORDER — POTASSIUM CHLORIDE CRYS ER 20 MEQ PO TBCR: 20.0000 meq | EXTENDED_RELEASE_TABLET | Freq: Every day | ORAL | 0 refills | Status: AC

## 2019-01-23 MED ORDER — IOHEXOL 300 MG/ML  SOLN
100.0000 mL | Freq: Once | INTRAMUSCULAR | Status: AC
  Administered 2019-01-23: 100 mL via INTRAVENOUS

## 2019-01-23 MED ORDER — PROBIOTIC PO CAPS: 1.0000 | ORAL_CAPSULE | Freq: Every day | ORAL | 1 refills | Status: AC

## 2019-01-23 MED ORDER — ONDANSETRON 4 MG PO TBDP: 4.0000 mg | ORAL_TABLET | Freq: Four times a day (QID) | ORAL | 0 refills | Status: AC | PRN

## 2019-01-23 MED ORDER — CIPROFLOXACIN HCL 500 MG PO TABS
500.0000 mg | ORAL_TABLET | Freq: Once | ORAL | Status: AC
  Administered 2019-01-23: 500 mg via ORAL
  Filled 2019-01-23: qty 1

## 2019-01-23 MED ORDER — POTASSIUM CHLORIDE CRYS ER 20 MEQ PO TBCR
40.0000 meq | EXTENDED_RELEASE_TABLET | Freq: Once | ORAL | Status: AC
  Administered 2019-01-23: 40 meq via ORAL
  Filled 2019-01-23: qty 2

## 2019-01-23 MED ORDER — DICYCLOMINE HCL 20 MG PO TABS: 20.0000 mg | ORAL_TABLET | Freq: Three times a day (TID) | ORAL | 0 refills | Status: AC | PRN

## 2019-01-23 MED ORDER — METRONIDAZOLE 500 MG PO TABS
500.0000 mg | ORAL_TABLET | Freq: Once | ORAL | Status: AC
  Administered 2019-01-23: 500 mg via ORAL
  Filled 2019-01-23: qty 1

## 2019-01-23 NOTE — Discharge Instructions (Addendum)
You may alternate Tylenol 1000 mg every 6 hours as needed for pain, fever and Ibuprofen 800 mg every 8 hours as needed for pain, fever.  Please take Ibuprofen with food.  Do not take more than 4000 mg of Tylenol (acetaminophen) in a 24 hour period.  You may take over-the-counter Imodium as needed for diarrhea.  We are treating you with antibiotics for possible infectious colitis.  I recommend close follow-up with a GI physician once antibiotics are complete to ensure resolution of symptoms.  They may recommend a colonoscopy as an outpatient.  I recommend while taking these antibiotics that you also take a probiotic daily.  Your potassium level was slightly low at 2.9 today.  I recommend follow-up with a primary care physician in 1 week to have this rechecked.   Steps to find a Primary Care Provider (PCP):  Call [phone] or [phone] to access "[place] Find a Doctor Service."  2.  You may also go on the [facility] website at [email or web address]  3.  [facility] also frequently accepts new patients.  [facility]  [address] [place] [address] [phone]  4.  There are also multiple [facility], [facility] and [facility] practices throughout the [place] that are frequently accepting new patients. You may find a clinic that is close to your home and contact them.  [email or web address] [phone]  [facility] [email or web address]  [email or web address] [phone]  [email or web address] [phone]  5.  Local Health Departments also can provide primary care services.  [facility]  [address] [place] [address] [phone]  [facility] Department [address] [place] [address] [phone]  [facility] Department [address] [place] [address]  [place]  [address] [phone]

## 2019-01-23 NOTE — ED Triage Notes (Addendum)
Pt c/o diffuse abd pain  After exercising x 1 day ago  denies n/v

## 2019-01-23 NOTE — ED Notes (Signed)
ED Provider at bedside. 

## 2019-01-23 NOTE — ED Notes (Signed)
Patient transported to CT 

## 2019-01-23 NOTE — ED Provider Notes (Signed)
TIME SEEN: 12:07 AM  CHIEF COMPLAINT: Abdominal pain, diarrhea  HPI: Patient is a 39 year old female with history of migraines, previous appendectomy who presents to the emergency department with generalized abdominal pain and diarrhea.  Patient states that she was working out with a trainer today and doing jump squats.  She states that she jumped very high and when she came down she felt like "my muscle ripped in half" in her abdomen.  She states that she thought she pulled a muscle and has been taking Tylenol and ibuprofen today with good relief.  She states however tonight her pain worsened and she felt like her abdomen appeared swollen and she had diarrhea "like I have never seen before".  No nausea or vomiting.  No fever.  No chest pain, shortness of breath, cough or Covid exposures.  No dysuria, hematuria, vaginal bleeding or discharge.  States that she suddenly felt like she was going to pass out tonight due to pain and became diaphoretic and states her lips appeared white "like a ghost".  States she initially went to [facility] hospital and while waiting in line felt like she had a very bad taste in her mouth and kept "clenching my teeth" due to pain.  States she is concerned she could have a hernia.  ROS: See HPI Constitutional: no fever  Eyes: no drainage  ENT: no runny nose   Cardiovascular:  no chest pain  Resp: no SOB  GI: no vomiting, + diarrhea GU: no dysuria Integumentary: no rash  Allergy: no hives  Musculoskeletal: no leg swelling  Neurological: no slurred speech ROS otherwise negative  PAST MEDICAL HISTORY/PAST SURGICAL HISTORY:  Past Medical History:  Diagnosis Date  . Asthma    when younger but as adult no major issues per pt.  . History of migraine   . Migraines   . Neck pain     MEDICATIONS:  Prior to Admission medications   Medication Sig Start Date End Date Taking? Authorizing Provider  albuterol (PROVENTIL HFA;VENTOLIN HFA) 108 (90 Base) MCG/ACT  inhaler Inhale 1-2 puffs into the lungs every 6 (six) hours as needed for wheezing or shortness of breath. 02/10/18   Khatri, Hina, PA-C  benzonatate (TESSALON) 100 MG capsule Take 1 capsule (100 mg total) by mouth every 8 (eight) hours. 02/10/18   Khatri, Hina, PA-C  cyclobenzaprine (FLEXERIL) 10 MG tablet Take 1 tab every 8 hours as needed for pain. 08/02/17   Saguier, Ramon Dredge, PA-C  meloxicam (MOBIC) 7.5 MG tablet 1-2 tab po q day 07/25/17   Saguier, Ramon Dredge, PA-C  SUMAtriptan (IMITREX) 50 MG tablet Take 1 tablet (50 mg total) by mouth every 2 (two) hours as needed for migraine. May repeat in 2 hours if headache persists or recurs. 07/25/17   Saguier, Ramon Dredge, PA-C  tizanidine (ZANAFLEX) 6 MG capsule Take 1 capsule (6 mg total) by mouth 3 (three) times daily as needed for muscle spasms. 07/25/17   Saguier, Ramon Dredge, PA-C    ALLERGIES:  Allergies  Allergen Reactions  . Penicillins     SOCIAL HISTORY:  Social History   Tobacco Use  . Smoking status: Never Smoker  . Smokeless tobacco: Never Used  Substance Use Topics  . Alcohol use: No    FAMILY HISTORY: Family History  Problem Relation Age of Onset  . COPD Mother   . Breast cancer Mother     EXAM: BP 116/80   Pulse 92   Temp 98.9 F (37.2 C) (Oral)   Resp 18  Ht 5\' 1"  (1.549 m)   Wt 59 kg   SpO2 100%   BMI 24.56 kg/m  CONSTITUTIONAL: Alert and oriented and responds appropriately to questions.  Appears uncomfortable HEAD: Normocephalic EYES: Conjunctivae clear, pupils appear equal, EOM appear intact ENT: normal nose; moist mucous membranes NECK: Supple, normal ROM CARD: RRR; S1 and S2 appreciated; no murmurs, no clicks, no rubs, no gallops RESP: Normal chest excursion without splinting or tachypnea; breath sounds clear and equal bilaterally; no wheezes, no rhonchi, no rales, no hypoxia or respiratory distress, speaking full sentences ABD/GI: Patient has hyperactive bowel sounds.  Her abdomen is nondistended and soft.  She is  diffusely tender especially throughout the middle of her abdomen around the umbilicus.  I do not appreciate any masses or hernia.  There is no redness, warmth, ecchymosis.  No guarding or rebound.  No peritoneal signs.  No tenderness in the right upper quadrant. BACK:  The back appears normal EXT: Normal ROM in all joints; no deformity noted, no edema; no cyanosis SKIN: Normal color for age and race; warm; no rash on exposed skin NEURO: Moves all extremities equally, ambulates with steady gait, normal speech PSYCH: The patient's mood and manner are appropriate.   MEDICAL DECISION MAKING: Discussed with patient that while this may be a pulled muscle I am concerned that her pain is worsening and she had an episode where she felt like she was going to pass out tonight secondary to pain and appeared pale, white and was diaphoretic.  She also reports severe diarrhea tonight.  Differential includes colitis, diverticulitis, hernia, bowel obstruction.  Will obtain labs, urine and a CT of her abdomen pelvis.  She declines any medicine for pain, diarrhea or nausea at this time.  ED PROGRESS: Patient's urine shows no sign of infection but does show moderate ketones.  Will give IV fluids.  Potassium level low at 2.9.  EKG shows no ischemia, no arrhythmia, no interval change.  Will give oral replacement.   1:40 AM  CT of the abdomen pelvis shows decompressed large bowel suspicious for diffuse mild acute colitis.  No bowel obstruction.  No hernia seen.  Will start patient on Cipro, Flagyl and probiotics..  Will give outpatient PCP as well as GI follow-up.  Will provide information for both she states she does not have a PCP or GI physician.  Will discharge with Bentyl, Zofran.    Recommended GI follow-up after antibiotic completion.  Low suspicion that this is inflammatory bowel disease that she has no history of the same, no family history and has never had GI issues before.  States she works in a  but does not work in the hospital and has not been in the hospital recently.  No known sick contacts, recent travel or antibiotic use.  Has only had one episode of diarrhea at home.  Low suspicion for C. Difficile.  Patient still declines pain medication.  I feel she is safe for discharge home.   At this time, I do not feel there is any life-threatening condition present. I have reviewed, interpreted and discussed all results (EKG, imaging, lab, urine as appropriate) and exam findings with patient/family. I have reviewed nursing notes and appropriate previous records.  I feel the patient is safe to be discharged home without further emergent workup and can continue workup as an outpatient as needed. Discussed usual and customary return precautions. Patient/family verbalize understanding and are comfortable with this plan.  Outpatient follow-up has been provided as needed.  All questions have been answered.     Date: 01/23/2019 1:20 AM  Rate: 90  Rhythm: normal sinus rhythm  QRS Axis: normal  Intervals: normal (PR interval 133 ms, QRS 89 ms, QTC 463 ms)  ST/T Wave abnormalities: normal  Conduction Disutrbances: none  Narrative Interpretation: unremarkable, no ischemia, normal intervals, no arrhythmia        Tanyiah Laurich was evaluated in Emergency Department on 01/23/2019 for the symptoms described in the history of present illness. She was evaluated in the context of the global COVID-19 pandemic, which necessitated consideration that the patient might be at risk for infection with the SARS-CoV-2 virus that causes COVID-19. Institutional protocols and algorithms that pertain to the evaluation of patients at risk for COVID-19 are in a state of rapid change based on information released by regulatory bodies including the CDC and federal and state organizations. These policies and algorithms were followed during the patient's care in the ED.  Patient was seen wearing N95, face shield, gloves.     Cortlan Dolin, Delice Bison, DO 01/23/19 [phone]

## 2019-07-03 ENCOUNTER — Emergency Department (HOSPITAL_BASED_OUTPATIENT_CLINIC_OR_DEPARTMENT_OTHER)
Admission: EM | Admit: 2019-07-03 | Discharge: 2019-07-03 | Disposition: A | Discharge status: Home / Self Care | Payer: 59 | Attending: Emergency Medicine | Admitting: Emergency Medicine

## 2019-07-03 ENCOUNTER — Other Ambulatory Visit: Payer: Self-pay

## 2019-07-03 ENCOUNTER — Encounter (HOSPITAL_BASED_OUTPATIENT_CLINIC_OR_DEPARTMENT_OTHER): Payer: Self-pay | Admitting: *Deleted

## 2019-07-03 DIAGNOSIS — Z7951 Long term (current) use of inhaled steroids: Secondary | ICD-10-CM | POA: Diagnosis not present

## 2019-07-03 DIAGNOSIS — J45909 Unspecified asthma, uncomplicated: Secondary | ICD-10-CM | POA: Insufficient documentation

## 2019-07-03 DIAGNOSIS — R070 Pain in throat: Secondary | ICD-10-CM | POA: Insufficient documentation

## 2019-07-03 DIAGNOSIS — K0889 Other specified disorders of teeth and supporting structures: Secondary | ICD-10-CM

## 2019-07-03 NOTE — ED Provider Notes (Signed)
[facility] EMERGENCY DEPARTMENT Provider Note   CSN: [id number] Arrival date & time: 07/03/19  1437     History Chief Complaint  Patient presents with  . Dental Pain    Kylie Hart is a 39 y.o. female with a past medical history significant for asthma and history of migraines who presents to the ED due to worsening right upper dental pain for the past week. Patient had a root canal yesterday by Dr. Georgann Housekeeper and is worried about possible sepsis from her root canal. Patient states she believes she has sepsis because her "legs shake" uncontrollably which started yesterday after a root canal. Denies fever and chills. She is currently on clindamycin and Tylenol. Patient is unable to take NSAIDs due to history of ulcers. Patient has requested azithromycin for her dental pain. Patient also admits to an intermittent sore throat. Denies difficulty swallowing, difficulties breathing, and changes to phonation. No aggravating or alleviating factors.  History obtained from patient and past medical records. No interpreter used during encounter.       Past Medical History:  Diagnosis Date  . Asthma    when younger but as adult no major issues per pt.  . History of migraine   . Migraines   . Neck pain     Patient Active Problem List   Diagnosis Date Noted  . 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 Cervical pain 10/09/2013  . DDD (degenerative disc disease), cervical 10/09/2013  . Cervical spinal stenosis 10/09/2013  . Cervicogenic headache 10/09/2013  . 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 APPENDECTOMY       OB History   No obstetric history on file.     Family History  Problem Relation Age of Onset  . COPD Mother   . Breast cancer Mother     Social History   Tobacco Use  . Smoking status: Never Smoker  . Smokeless tobacco: Never Used  Vaping Use  . Vaping Use: Never assessed  Substance Use Topics  .  Alcohol use: No  . Drug use: No    Home Medications Prior to Admission medications   Medication Sig Start Date End Date Taking? Authorizing Provider  acetaminophen-codeine (TYLENOL #3) 300-30 MG tablet Take by mouth every 4 (four) hours as needed for moderate pain.   Yes [provider]  CLINDAMYCIN HCL PO Take by mouth.   Yes [provider]  albuterol (PROVENTIL HFA;VENTOLIN HFA) 108 (90 Base) MCG/ACT inhaler Inhale 1-2 puffs into the lungs every 6 (six) hours as needed for wheezing or shortness of breath. 02/10/18   Khatri, Hina, PA-C  benzonatate (TESSALON) 100 MG capsule Take 1 capsule (100 mg total) by mouth every 8 (eight) hours. 02/10/18   Khatri, Hina, PA-C  ciprofloxacin (CIPRO) 500 MG tablet Take 1 tablet (500 mg total) by mouth every 12 (twelve) hours. 01/23/19   Ward, Delice Bison, DO  cyclobenzaprine (FLEXERIL) 10 MG tablet Take 1 tab every 8 hours as needed for pain. 08/02/17   Saguier, Percell Miller, PA-C  dicyclomine (BENTYL) 20 MG tablet Take 1 tablet (20 mg total) by mouth every 8 (eight) hours as needed for spasms (Abdominal cramping). 01/23/19   Ward, Delice Bison, DO  meloxicam (MOBIC) 7.5 MG tablet 1-2 tab po q day 07/25/17   Saguier, Percell Miller, PA-C  metroNIDAZOLE (FLAGYL) 500 MG tablet Take 1 tablet (500 mg total) by mouth 3 (three) times daily. Do not drink alcohol when taking this medication. 01/23/19  Ward, Kristen N, DO  ondansetron (ZOFRAN ODT) 4 MG disintegrating tablet Take 1 tablet (4 mg total) by mouth every 6 (six) hours as needed for nausea or vomiting. 01/23/19   Ward, Layla Maw, DO  potassium chloride SA (KLOR-CON) 20 MEQ tablet Take 1 tablet (20 mEq total) by mouth daily. 01/23/19   Ward, Layla Maw, DO  Probiotic CAPS Take 1 capsule by mouth daily. 01/23/19   Ward, Layla Maw, DO  SUMAtriptan (IMITREX) 50 MG tablet Take 1 tablet (50 mg total) by mouth every 2 (two) hours as needed for migraine. May repeat in 2 hours if headache persists or recurs. 07/25/17   Saguier,  Ramon Dredge, PA-C  tizanidine (ZANAFLEX) 6 MG capsule Take 1 capsule (6 mg total) by mouth 3 (three) times daily as needed for muscle spasms. 07/25/17   Saguier, Ramon Dredge, PA-C    Allergies    Penicillins  Review of Systems   Review of Systems  Constitutional: Negative for chills and fever.  HENT: Positive for dental problem and sore throat. Negative for ear pain, facial swelling, trouble swallowing and voice change.   Respiratory: Negative for shortness of breath.   All other systems reviewed and are negative.   Physical Exam Updated Vital Signs BP 125/86   Pulse 90   Temp 99 F (37.2 C) (Oral)   Resp 14   Ht 5\' 1"  (1.549 m)   Wt 59 kg   SpO2 100%   BMI 24.56 kg/m   Physical Exam Vitals and nursing note reviewed.  Constitutional:      General: She is not in acute distress.    Appearance: She is not ill-appearing.  HENT:     Head: Normocephalic.     Mouth/Throat:     Comments: Recent root canal on right upper posterior molar. No appreciated abscess. No surrounding gingivitis. Good dentition throughout. Mild erythema in throat. No tonsillar hypertrophy or exudates. Normal phonation. Tolerating oral secretions without difficulty. Tongue in normal position without protrusion. No tenderness palpation under tongue. No tenderness or edema throughout cheek or below jaw. Eyes:     Pupils: Pupils are equal, round, and reactive to light.  Neck:     Comments: No meningismus. No cervical adenopathy. Cardiovascular:     Rate and Rhythm: Normal rate and regular rhythm.     Pulses: Normal pulses.     Heart sounds: Normal heart sounds. No murmur heard.  No friction rub. No gallop.   Pulmonary:     Effort: Pulmonary effort is normal.     Breath sounds: Normal breath sounds.  Abdominal:     General: Abdomen is flat. There is no distension.     Palpations: Abdomen is soft.     Tenderness: There is no abdominal tenderness. There is no guarding or rebound.  Musculoskeletal:     Cervical  back: Neck supple.     Comments: Able to move all 4 extremities without difficulty.  Skin:    General: Skin is warm and dry.  Neurological:     General: No focal deficit present.     Mental Status: She is alert.  Psychiatric:        Mood and Affect: Mood normal.        Behavior: Behavior normal.     ED Results / Procedures / Treatments   Labs (all labs ordered are listed, but only abnormal results are displayed) Labs Reviewed - No data to display  EKG None  Radiology No results found.  Procedures Procedures (including critical  care time)  Medications Ordered in ED Medications - No data to display  ED Course  I have reviewed the triage vital signs and the nursing notes.  Pertinent labs & imaging results that were available during my care of the patient were reviewed by me and considered in my medical decision making (see chart for details).    MDM Rules/Calculators/A&P                         39 year old female presents to the ED due to upper right dental pain for the past week. Patient had a root canal yesterday by Dr. Clementeen Graham and is concerned about possible sepsis. Patient notes she checks her temperature very frequently and has not had a documented fever. She is on clindamycin and Tylenol which she has been compliant with. Upon arrival patient is afebrile, not tachycardic or hypoxic. No concern for sepsis at this time. Recent root canal to posterior right upper molar. No surrounding abscess or gingivitis. No concern for Lugwigs or deep space infection. Patient also admits to a sore throat. Mild erythema in the throat without tonsillar hypertrophy or exudates. Normal phonation. Patient tolerated oral secretions without difficulty. Offered strep test however patient deferred test. No meningismus to suggest meningitis. Patient requested azithromycin for her dental infection. I had a long conversation with patient stating that clindamycin is the best for dental infections. I  reassured patient that her vitals look good today without fever or tachycardia and we do not draw sepsis labs unless indicated. Instructed patient to return to the ER if she develops a fever or worsening symptoms. Strict ED precautions discussed with patient. Patient states understanding and agrees to plan. Patient discharged home in no acute distress and stable vitals.  Final Clinical Impression(s) / ED Diagnoses Final diagnoses:  Pain, dental    Rx / DC Orders ED Discharge Orders    None       Jesusita Oka 07/03/19 1522    Little, Ambrose Finland, MD 07/04/19 [phone]

## 2019-07-03 NOTE — Discharge Instructions (Signed)
As discussed, your vitals were all normal today. You did not have a fever and your heart rate is normal which are all reassuring. Continue taking your antibiotic and pain medication as prescribed. Please call your doctor's office if symptoms do not improved within the next few days. Return to the ER if you develop a fever or elevated heart rate. Return to the ER for new or worsening symptoms.

## 2019-07-03 NOTE — ED Triage Notes (Signed)
Dental pain x 1 week.  

## 2020-09-28 ENCOUNTER — Encounter (HOSPITAL_BASED_OUTPATIENT_CLINIC_OR_DEPARTMENT_OTHER): Payer: Self-pay | Admitting: *Deleted

## 2021-06-03 IMAGING — CT CT ABD-PELV W/ CM
2 of 4 series · 16 of 46 positions shown, 18 images · IV contrast (Omnipaque)
Comparison: Chest radiographs 02/10/2018.

CLINICAL DATA: 38-year-old female with generalized abdominal pain
and diarrhea. History of appendectomy.

EXAM:
CT ABDOMEN AND PELVIS WITH CONTRAST
TECHNIQUE: Multidetector CT imaging of the abdomen and pelvis was performed
using the standard protocol following bolus administration of
intravenous contrast.
CONTRAST:  100mL OMNIPAQUE IOHEXOL 300 MG/ML  SOLN

[Series 2: axial st · axial · 0.73mm/px · z∈[-892,-512]mm · 13 of 84 slices shown, 15 images]
[im 4/84  soft-tissue]
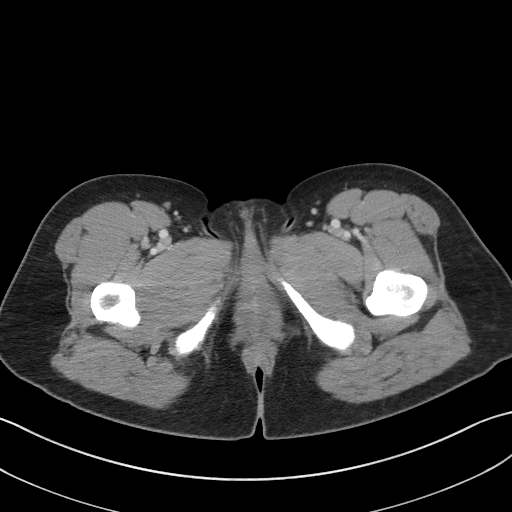
[im 4/84  bone]
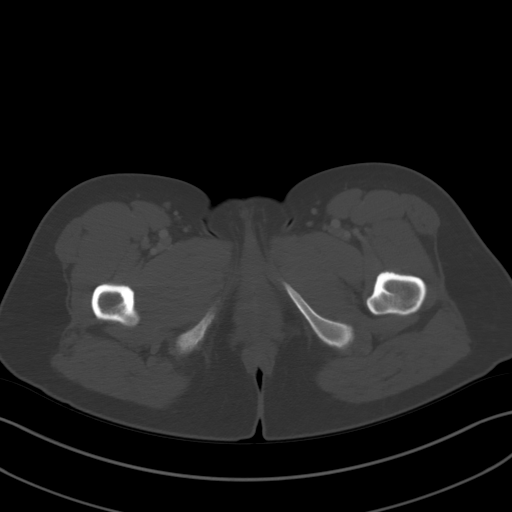
[im 11/84  soft-tissue]
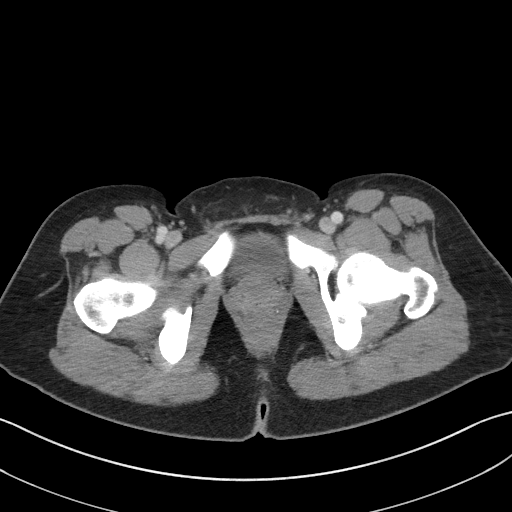
[im 19/84  soft-tissue]
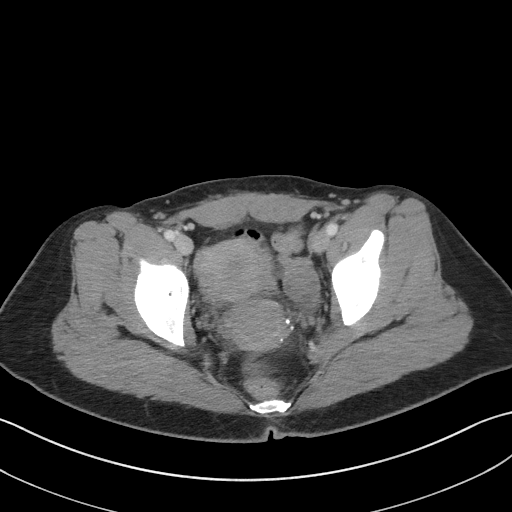
[im 22/84  soft-tissue]
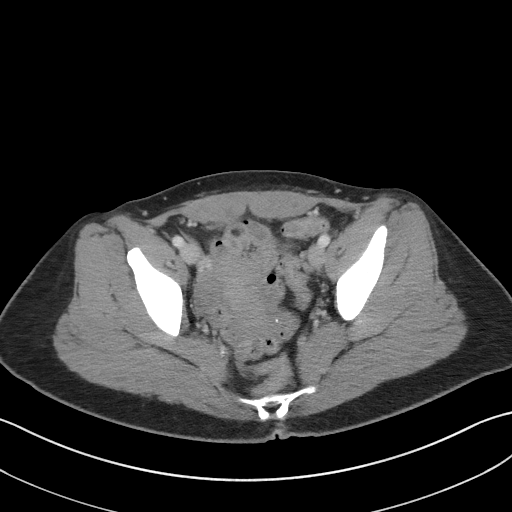
[im 29/84  soft-tissue]
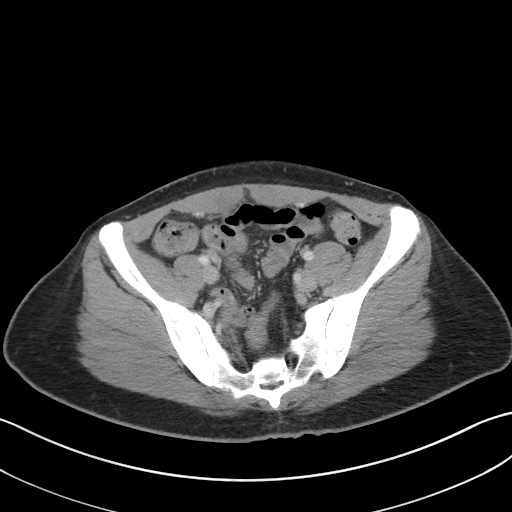
[im 37/84  soft-tissue]
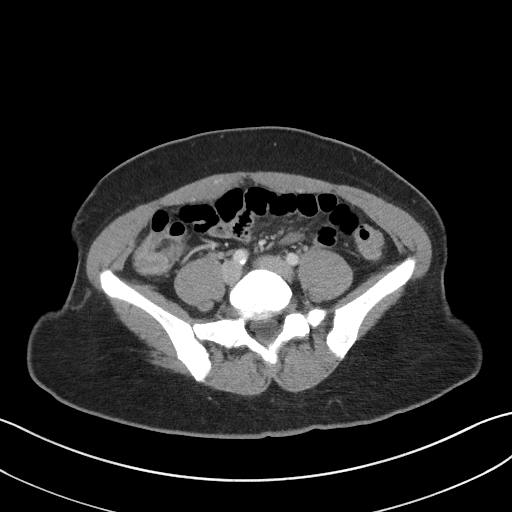
[im 44/84  soft-tissue]
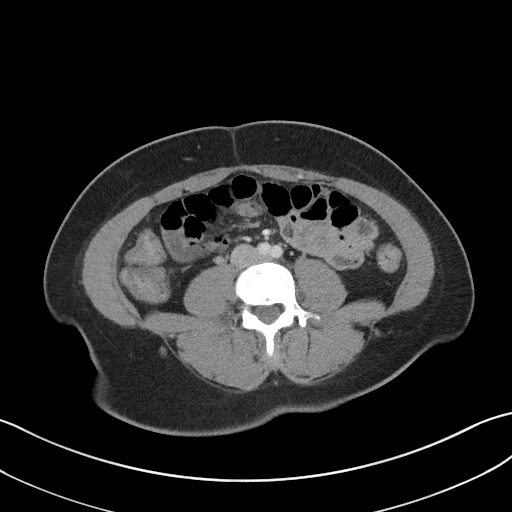
[im 47/84  soft-tissue]
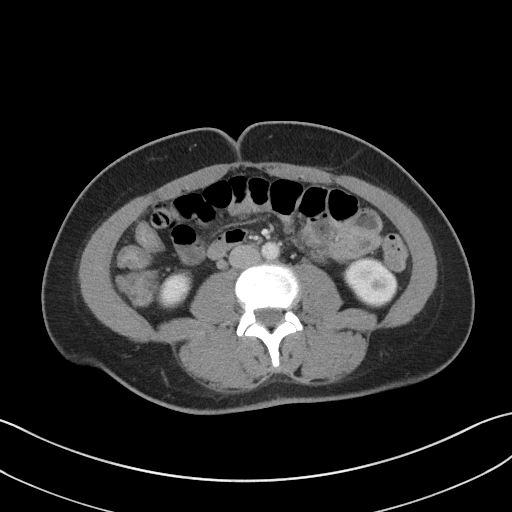
[im 55/84  soft-tissue]
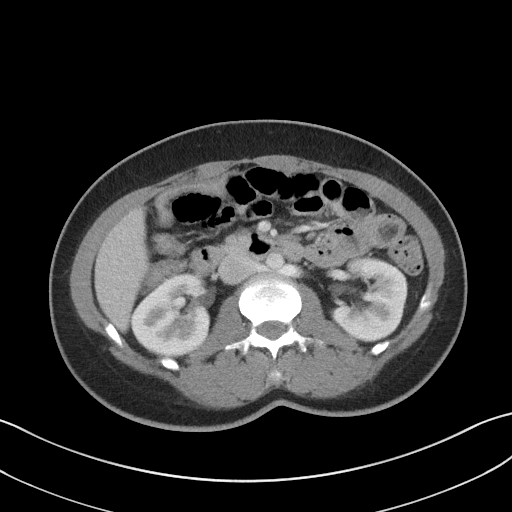
[im 55/84  bone]
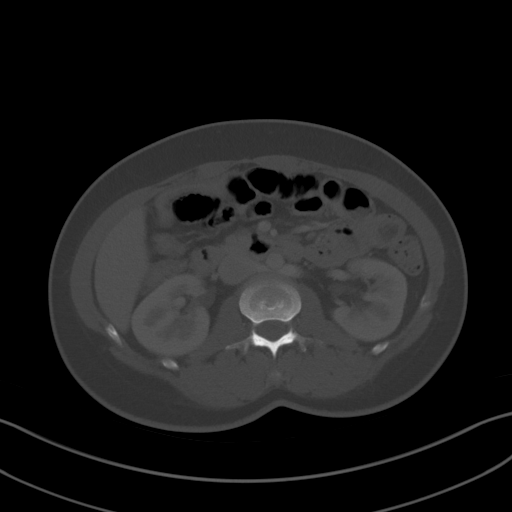
[im 62/84  soft-tissue]
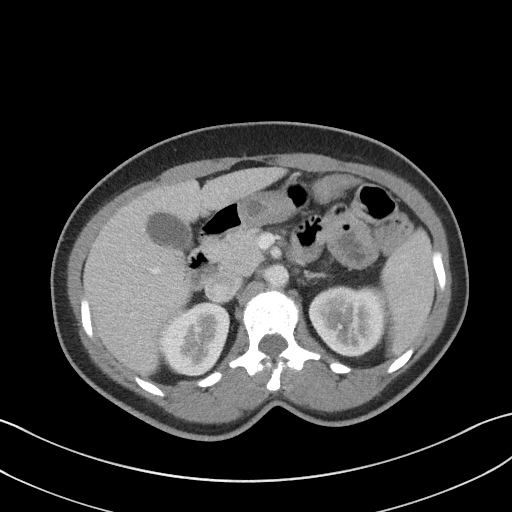
[im 65/84  soft-tissue]
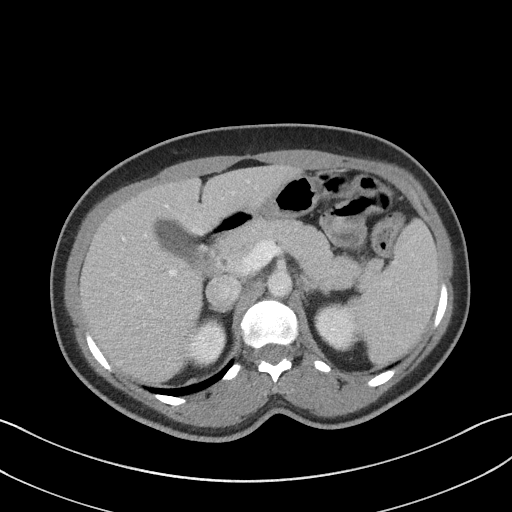
[im 73/84  soft-tissue]
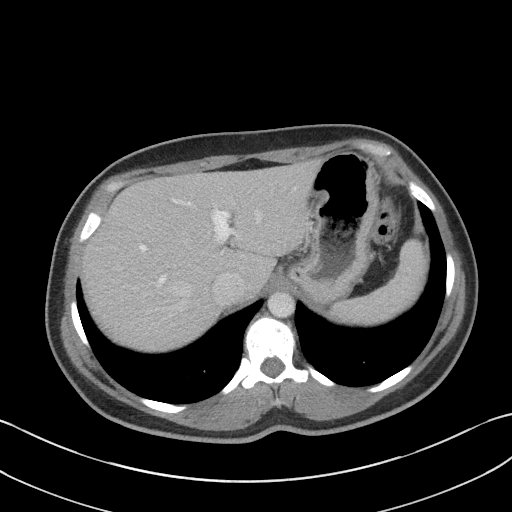
[im 80/84  soft-tissue]
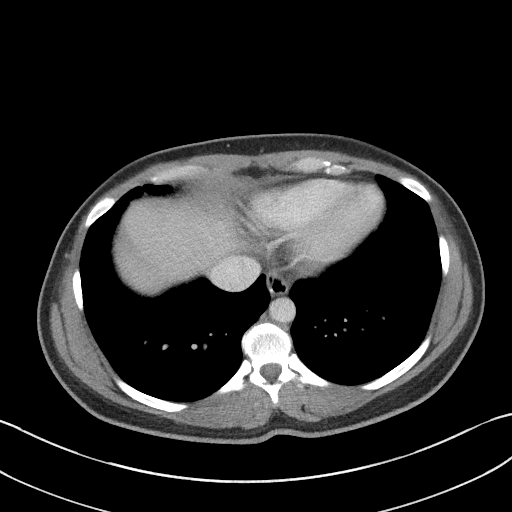

[Series 5: coronal st · coronal · 0.64mm/px · 3 of 93 slices shown]
[im 31/93  soft-tissue]
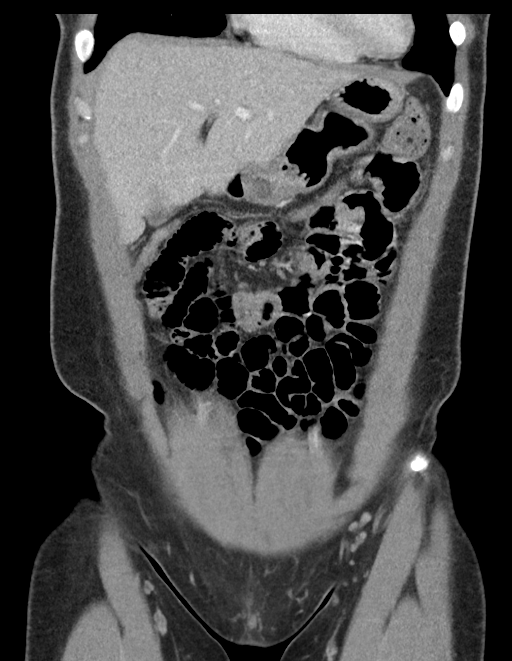
[im 41/93  soft-tissue]
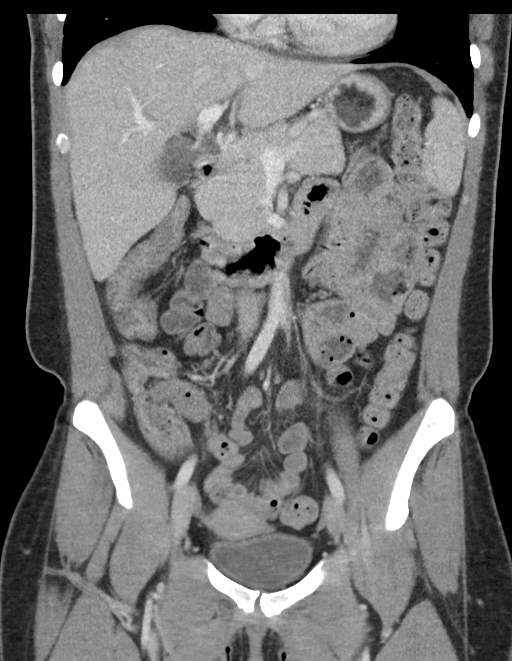
[im 52/93  soft-tissue]
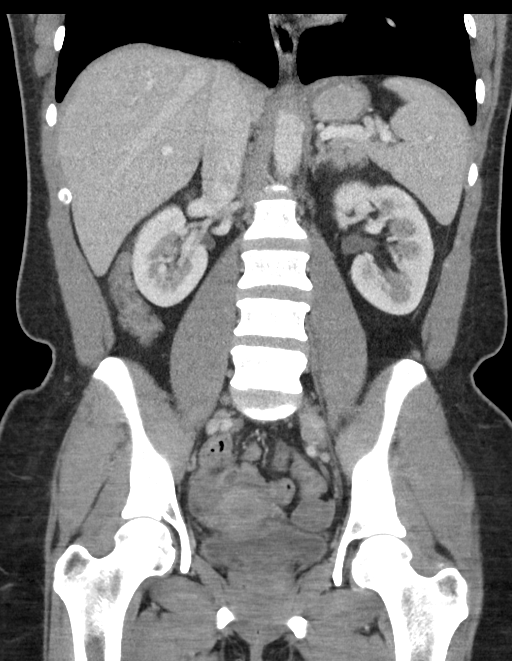

[16 of 46 positions shown; findings below may reference images not displayed]

FINDINGS: Lower chest: Negative.

Hepatobiliary: Negative liver and gallbladder. No bile duct
enlargement.

Pancreas: Negative.

Spleen: Negative.

Adrenals/Urinary Tract: Normal adrenal glands.

Symmetric and normal bilateral renal enhancement. Questionable
punctate nephrolithiasis at the right mid pole on coronal image 56.
Proximal ureters appear decompressed and normal. Diminutive and
unremarkable urinary bladder. Left greater than right pelvic
phleboliths.

Stomach/Bowel: Decompressed but somewhat indistinct rectum and
distal sigmoid colon. Decompressed but more normal appearing
proximal sigmoid. Similar decompression of the descending colon. The
transverse colon is completely decompressed and indistinct as is the
hepatic flexure which is mildly redundant. The right colon is
decompressed. Terminal ileum appears within normal limits on coronal
image 42. No dilated small bowel. No small bowel inflammation is
evident. Largely decompressed stomach. No free air, free fluid.

Vascular/Lymphatic: Mild Aortoiliac calcified atherosclerosis. Major
arterial structures are patent. Portal venous system is patent. No
lymphadenopathy.

Reproductive: Negative.

Other: Trace simple appearing free fluid in the cul-de-sac on series
2, image 65.

Musculoskeletal: Chronic unilateral right L5 pars fracture. No L5-S1
spondylolisthesis. No acute osseous abnormality identified.
IMPRESSION: 1. The large bowel is decompressed but also somewhat indistinct
appearing, suspicious for diffuse mild acute colitis in this
setting.
2. Trace free fluid in the cul-de-sac is likely physiologic.
3. No other acute or inflammatory process in the abdomen or pelvis.
4. Chronic unilateral right L5 pars fracture.
